# Patient Record
Sex: Male | Born: 1975 | Race: White | Hispanic: No | Marital: Married | Smoking: Never smoker
Health system: Southern US, Community
[De-identification: ages and names within clinical notes are randomized; demographics above are authoritative.]

## PROBLEM LIST (undated history)

## (undated) DIAGNOSIS — I10 Essential (primary) hypertension: Secondary | ICD-10-CM

---

## 2013-07-15 ENCOUNTER — Encounter (HOSPITAL_COMMUNITY): Payer: Self-pay | Admitting: Emergency Medicine

## 2013-07-15 ENCOUNTER — Emergency Department (HOSPITAL_COMMUNITY): Payer: 59

## 2013-07-15 ENCOUNTER — Emergency Department (HOSPITAL_COMMUNITY)
Admission: EM | Admit: 2013-07-15 | Discharge: 2013-07-16 | Disposition: A | Discharge status: Home / Self Care | Payer: 59 | Attending: Emergency Medicine | Admitting: Emergency Medicine

## 2013-07-15 DIAGNOSIS — S46909A Unspecified injury of unspecified muscle, fascia and tendon at shoulder and upper arm level, unspecified arm, initial encounter: Secondary | ICD-10-CM | POA: Insufficient documentation

## 2013-07-15 DIAGNOSIS — I10 Essential (primary) hypertension: Secondary | ICD-10-CM | POA: Insufficient documentation

## 2013-07-15 DIAGNOSIS — S4980XA Other specified injuries of shoulder and upper arm, unspecified arm, initial encounter: Secondary | ICD-10-CM | POA: Insufficient documentation

## 2013-07-15 DIAGNOSIS — Y9389 Activity, other specified: Secondary | ICD-10-CM | POA: Insufficient documentation

## 2013-07-15 DIAGNOSIS — IMO0002 Reserved for concepts with insufficient information to code with codable children: Secondary | ICD-10-CM | POA: Insufficient documentation

## 2013-07-15 DIAGNOSIS — M7918 Myalgia, other site: Secondary | ICD-10-CM

## 2013-07-15 DIAGNOSIS — S0990XA Unspecified injury of head, initial encounter: Secondary | ICD-10-CM | POA: Insufficient documentation

## 2013-07-15 DIAGNOSIS — S51009A Unspecified open wound of unspecified elbow, initial encounter: Secondary | ICD-10-CM | POA: Insufficient documentation

## 2013-07-15 DIAGNOSIS — Y92410 Unspecified street and highway as the place of occurrence of the external cause: Secondary | ICD-10-CM | POA: Insufficient documentation

## 2013-07-15 HISTORY — DX: Essential (primary) hypertension: I10

## 2013-07-15 MED ORDER — HYDROCODONE-ACETAMINOPHEN 5-325 MG PO TABS
1.0000 | ORAL_TABLET | Freq: Once | ORAL | Status: AC
  Administered 2013-07-15: 1 via ORAL
  Filled 2013-07-15: qty 2
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
## 2013-07-15 NOTE — ED Notes (Signed)
Patient transported to CT 

## 2013-07-15 NOTE — ED Notes (Signed)
Suture Cart at bedside.  
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
## 2013-07-16 MED ORDER — HYDROCODONE-ACETAMINOPHEN 5-325 MG PO TABS: 1.0000 | ORAL_TABLET | Freq: Four times a day (QID) | ORAL | Status: AC | PRN

## 2013-07-16 MED ORDER — RAMIPRIL 10 MG PO CAPS: 10.0000 mg | ORAL_CAPSULE | Freq: Every day | ORAL | Status: AC

## 2013-07-16 MED ORDER — ALPRAZOLAM ER 3 MG PO TB24: 3.0000 mg | ORAL_TABLET | ORAL | Status: AC

## 2013-07-16 MED ORDER — ESCITALOPRAM OXALATE 20 MG PO TABS: 20.0000 mg | ORAL_TABLET | Freq: Once | ORAL | Status: AC

## 2013-07-16 MED ORDER — AMLODIPINE BESYLATE 10 MG PO TABS: 10.0000 mg | ORAL_TABLET | Freq: Every day | ORAL | Status: AC

## 2013-07-16 MED ORDER — ESCITALOPRAM OXALATE 10 MG PO TABS
20.0000 mg | ORAL_TABLET | Freq: Once | ORAL | Status: AC
  Administered 2013-07-16: 20 mg via ORAL
  Filled 2013-07-16: qty 2

## 2013-07-16 NOTE — Discharge Instructions (Signed)
Musculoskeletal Pain Musculoskeletal pain is muscle and boney aches and pains. These pains can occur in any part of the body. Your caregiver may treat you without knowing the cause of the pain. They may treat you if blood or urine tests, X-rays, and other tests were normal.  CAUSES There is often not a definite cause or reason for these pains. These pains may be caused by a type of germ (virus). The discomfort may also come from overuse. Overuse includes working out too hard when your body is not fit. Boney aches also come from weather changes. Bone is sensitive to atmospheric pressure changes. HOME CARE INSTRUCTIONS   Ask when your test results will be ready. Make sure you get your test results.  Only take over-the-counter or prescription medicines for pain, discomfort, or fever as directed by your caregiver. If you were given medications for your condition, do not drive, operate machinery or power tools, or sign legal documents for 24 hours. Do not drink alcohol. Do not take sleeping pills or other medications that may interfere with treatment.  Continue all activities unless the activities cause more pain. When the pain lessens, slowly resume normal activities. Gradually increase the intensity and duration of the activities or exercise.  During periods of severe pain, bed rest may be helpful. Lay or sit in any position that is comfortable.  Putting ice on the injured area.  Put ice in a bag.  Place a towel between your skin and the bag.  Leave the ice on for 15 to 20 minutes, 3 to 4 times a day.  Follow up with your caregiver for continued problems and no reason can be found for the pain. If the pain becomes worse or does not go away, it may be necessary to repeat tests or do additional testing. Your caregiver may need to look further for a possible cause. SEEK IMMEDIATE MEDICAL CARE IF:  You have pain that is getting worse and is not relieved by medications.  You develop chest pain  that is associated with shortness or breath, sweating, feeling sick to your stomach (nauseous), or throw up (vomit).  Your pain becomes localized to the abdomen.  You develop any new symptoms that seem different or that concern you. MAKE SURE YOU:   Understand these instructions.  Will watch your condition.  Will get help right away if you are not doing well or get worse. Document Released: 03/15/2005 Document Revised: 06/07/2011 Document Reviewed: 11/17/2012 [facility] Patient Information 2014 [place], [place]. Laceration Care, Adult A laceration is a cut or lesion that goes through all layers of the skin and into the tissue just beneath the skin. TREATMENT  Some lacerations may not require closure. Some lacerations may not be able to be closed due to an increased risk of infection. It is important to see your caregiver as soon as possible after an injury to minimize the risk of infection and maximize the opportunity for successful closure. If closure is appropriate, pain medicines may be given, if needed. The wound will be cleaned to help prevent infection. Your caregiver will use stitches (sutures), staples, wound glue (adhesive), or skin adhesive strips to repair the laceration. These tools bring the skin edges together to allow for faster healing and a better cosmetic outcome. However, all wounds will heal with a scar. Once the wound has healed, scarring can be minimized by covering the wound with sunscreen during the day for 1 full year. HOME CARE INSTRUCTIONS  For sutures or staples:  Keep  the wound clean and dry.  If you were given a bandage (dressing), you should change it at least once a day. Also, change the dressing if it becomes wet or dirty, or as directed by your caregiver.  Wash the wound with soap and water 2 times a day. Rinse the wound off with water to remove all soap. Pat the wound dry with a clean towel.  After cleaning, apply a thin layer of the antibiotic ointment as  recommended by your caregiver. This will help prevent infection and keep the dressing from sticking.  You may shower as usual after the first 24 hours. Do not soak the wound in water until the sutures are removed.  Only take over-the-counter or prescription medicines for pain, discomfort, or fever as directed by your caregiver.  Get your sutures or staples removed as directed by your caregiver. For skin adhesive strips:  Keep the wound clean and dry.  Do not get the skin adhesive strips wet. You may bathe carefully, using caution to keep the wound dry.  If the wound gets wet, pat it dry with a clean towel.  Skin adhesive strips will fall off on their own. You may trim the strips as the wound heals. Do not remove skin adhesive strips that are still stuck to the wound. They will fall off in time. For wound adhesive:  You may briefly wet your wound in the shower or bath. Do not soak or scrub the wound. Do not swim. Avoid periods of heavy perspiration until the skin adhesive has fallen off on its own. After showering or bathing, gently pat the wound dry with a clean towel.  Do not apply liquid medicine, cream medicine, or ointment medicine to your wound while the skin adhesive is in place. This may loosen the film before your wound is healed.  If a dressing is placed over the wound, be careful not to apply tape directly over the skin adhesive. This may cause the adhesive to be pulled off before the wound is healed.  Avoid prolonged exposure to sunlight or tanning lamps while the skin adhesive is in place. Exposure to ultraviolet light in the first year will darken the scar.  The skin adhesive will usually remain in place for 5 to 10 days, then naturally fall off the skin. Do not pick at the adhesive film. You may need a tetanus shot if:  You cannot remember when you had your last tetanus shot.  You have never had a tetanus shot. If you get a tetanus shot, your arm may swell, get red, and  feel warm to the touch. This is common and not a problem. If you need a tetanus shot and you choose not to have one, there is a rare chance of getting tetanus. Sickness from tetanus can be serious. SEEK MEDICAL CARE IF:   You have redness, swelling, or increasing pain in the wound.  You see a red line that goes away from the wound.  You have yellowish-white fluid (pus) coming from the wound.  You have a fever.  You notice a bad smell coming from the wound or dressing.  Your wound breaks open before or after sutures have been removed.  You notice something coming out of the wound such as wood or glass.  Your wound is on your hand or foot and you cannot move a finger or toe. SEEK IMMEDIATE MEDICAL CARE IF:   Your pain is not controlled with prescribed medicine.  You have severe swelling  around the wound causing pain and numbness or a change in color in your arm, hand, leg, or foot.  Your wound splits open and starts bleeding.  You have worsening numbness, weakness, or loss of function of any joint around or beyond the wound.  You develop painful lumps near the wound or on the skin anywhere on your body. MAKE SURE YOU:   Understand these instructions.  Will watch your condition.  Will get help right away if you are not doing well or get worse. Document Released: 03/15/2005 Document Revised: 06/07/2011 Document Reviewed: 09/08/2010 [facility] Patient Information 2014 [place], [place].
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
